# Patient Record
Sex: Male | Born: 1965
Health system: Western US, Academic
[De-identification: ages and names within clinical notes are randomized; demographics above are authoritative.]

---

## 2021-03-09 ENCOUNTER — Ambulatory Visit: Payer: MEDICAID

## 2021-03-09 ENCOUNTER — Inpatient Hospital Stay
Admit: 2021-03-10 | Discharge: 2021-03-10 | Discharge status: Against Medical Advice | Payer: MEDICAID | Source: Home / Self Care | Attending: Emergency Medical Services

## 2021-03-10 DIAGNOSIS — K56609 Unspecified intestinal obstruction, unspecified as to partial versus complete obstruction: Secondary | ICD-10-CM

## 2021-03-10 LAB — C-Reactive Protein: C-REACTIVE PROTEIN: <0.3 mg/dL (ref <0.8)

## 2021-03-10 LAB — Vitamin B12: VITAMIN B12: <150 pg/mL — ABNORMAL LOW (ref 254–1060)

## 2021-03-10 LAB — Phosphorus: PHOSPHORUS: 3.1 mg/dL (ref 2.3–4.4)

## 2021-03-10 LAB — Basic Metabolic Panel
CHLORIDE: 107 mmol/L — ABNORMAL HIGH (ref 96–106)
GLUCOSE: 86 mg/dL (ref 65–99)

## 2021-03-10 LAB — Expedited COVID-19 and Influenza A B PCR

## 2021-03-10 LAB — CREATININE: CREATININE: 0.92 mg/dL (ref 0.60–1.30)

## 2021-03-10 LAB — Sedimentation Rate, Erythrocyte: SEDIMENTATION RATE, ERYTHROCYTE: 3 mm/h (ref <=12)

## 2021-03-10 LAB — Lipid Panel: CHOLESTEROL, HDL: 43 mg/dL (ref >40–<100)

## 2021-03-10 LAB — Ferritin: FERRITIN: 11 ng/mL (ref 8–350)

## 2021-03-10 LAB — Troponin I: TROPONIN I: <0.04 ng/mL (ref <0.04)

## 2021-03-10 LAB — Haptoglobin: HAPTOGLOBIN: 104 mg/dL (ref 21–210)

## 2021-03-10 LAB — Folate,Serum: FOLATE,SERUM: 22.3 ng/mL (ref 8.1–30.4)

## 2021-03-10 LAB — Iron & Iron Binding Capacity: % SATURATION: 8 (ref 262–502)

## 2021-03-10 LAB — Differential Automated: ABSOLUTE MONO COUNT: 0.59 10*3/uL (ref 0.20–0.80)

## 2021-03-10 LAB — Magnesium
MAGNESIUM: 1.5 meq/L (ref 1.4–1.9)
MAGNESIUM: 1.4 meq/L (ref 1.4–1.9)

## 2021-03-10 LAB — Electrolyte Panel: SODIUM: 142 mmol/L (ref 135–146)

## 2021-03-10 LAB — Reticulocyte Count: ABSOLUTE RETIC #: 0.05 x10E6/uL (ref 0.03–0.19)

## 2021-03-10 LAB — Extra Lavender Top

## 2021-03-10 LAB — Glucose,POC: GLUCOSE,POC: 107 mg/dL — ABNORMAL HIGH (ref 65–99)

## 2021-03-10 LAB — Extra Light Green Top

## 2021-03-10 LAB — Prothrombin Time Panel: INR: 1 s (ref 11.5–14.4)

## 2021-03-10 LAB — Calcium: CALCIUM: 8.3 mg/dL — ABNORMAL LOW (ref 8.6–10.4)

## 2021-03-10 LAB — Lactate Dehydrogenase: LACTATE DEHYDROGENASE: 300 U/L — ABNORMAL HIGH (ref 125–256)

## 2021-03-10 LAB — Bilirubin,Conjugated: BILIRUBIN,CONJUGATED: <0.2 mg/dL (ref <=0.3)

## 2021-03-10 LAB — Alanine Aminotransferase: ALANINE AMINOTRANSFERASE: 23 U/L (ref 8–70)

## 2021-03-10 LAB — Glucose, Whole Blood: GLUCOSE, WHOLE BLOOD: 105 mg/dL — ABNORMAL HIGH (ref 65–99)

## 2021-03-10 LAB — D-Dimer: D-DIMER STAGO: 1.76 ug{FEU}/mL — ABNORMAL HIGH (ref <0.60)

## 2021-03-10 LAB — Blood Lactate: BLOOD LACTATE: 8 mg/dL (ref 5–18)

## 2021-03-10 LAB — Aspartate Aminotransferase: ASPARTATE AMINOTRANSFERASE: 25 U/L (ref 13–62)

## 2021-03-10 LAB — CEA: CARCINOEMBRYONIC ANTIGEN: 7.9 ng/mL — ABNORMAL HIGH (ref <3.1)

## 2021-03-10 LAB — Albumin: ALBUMIN: 3.8 g/dL — ABNORMAL LOW (ref 3.9–5.0)

## 2021-03-10 LAB — CBC
ABSOLUTE NUCLEATED RBC COUNT: 0 10*3/uL (ref 0.00–0.00)
MCH CONCENTRATION: 30.6 g/dL — ABNORMAL LOW (ref 31.5–35.5)

## 2021-03-10 LAB — Hgb A1c: HGB A1C - HPLC: 4.8 (ref <5.7)

## 2021-03-10 LAB — Alkaline Phosphatase: ALKALINE PHOSPHATASE: 73 U/L (ref 37–113)

## 2021-03-10 LAB — Fibrinogen: FIBRINOGEN: 278 mg/dL (ref 235–490)

## 2021-03-10 LAB — Bilirubin,Total: BILIRUBIN,TOTAL: 0.3 mg/dL (ref 0.1–1.2)

## 2021-03-10 LAB — APTT: APTT: 26.5 s (ref 24.4–36.2)

## 2021-03-10 MED ADMIN — HYDROMORPHONE HCL 1 MG/ML IJ SOLN: 2 mg | INTRAVENOUS | @ 04:00:00 | Stop: 2021-03-10 | NDC 00409128331

## 2021-03-10 MED ADMIN — HYDROMORPHONE HCL 2 MG/ML IJ SOLN: 2 mg | INTRAVENOUS | @ 09:00:00 | Stop: 2021-03-10 | NDC 00641615101

## 2021-03-10 MED ADMIN — HYDROMORPHONE HCL 1 MG/ML IJ SOLN: 2 mg | INTRAVENOUS | @ 06:00:00 | Stop: 2021-03-10 | NDC 00409128331

## 2021-03-10 MED ADMIN — BISACODYL 10 MG RE SUPP: 10 mg | RECTAL | @ 09:00:00 | Stop: 2021-03-10 | NDC 00574705012

## 2021-03-10 MED ADMIN — ALBUTEROL SULFATE 2.5 MG/0.5ML IN NEBU: 5 mg | RESPIRATORY_TRACT | @ 08:00:00 | Stop: 2021-03-10 | NDC 00487990130

## 2021-03-10 MED ADMIN — HYDROMORPHONE HCL 1 MG/ML IJ SOLN: 1 mg | INTRAVENOUS | @ 04:00:00 | Stop: 2021-03-10 | NDC 00409128331

## 2021-03-10 MED ADMIN — LACTATED RINGERS IV SOLN: 100 mL/h | INTRAVENOUS | @ 12:00:00 | Stop: 2021-03-10 | NDC 00338011704

## 2021-03-10 MED ADMIN — LACTATED RINGERS IV SOLN: 100 mL/h | INTRAVENOUS | @ 12:00:00 | Stop: 2021-03-10

## 2021-03-10 MED ADMIN — HYDROMORPHONE HCL 1 MG/ML IJ SOLN: 2 mg | INTRAVENOUS | @ 07:00:00 | Stop: 2021-03-10 | NDC 00409128331

## 2021-03-10 MED ADMIN — HYDROMORPHONE HCL 2 MG/ML IJ SOLN: 2 mg | INTRAVENOUS | @ 13:00:00 | Stop: 2021-03-10 | NDC 00641615101

## 2021-03-10 MED ADMIN — HYDROMORPHONE HCL 1 MG/ML IJ SOLN: 1 mg | INTRAVENOUS | @ 05:00:00 | Stop: 2021-03-10 | NDC 00409128331

## 2021-03-10 MED ADMIN — ONDANSETRON HCL 4 MG/2ML IJ SOLN: 4 mg | INTRAVENOUS | @ 07:00:00 | Stop: 2021-03-10 | NDC 00641607801

## 2021-03-10 MED ADMIN — HYDROMORPHONE HCL 1 MG/ML IJ SOLN: 1 mg | INTRAVENOUS | @ 11:00:00 | Stop: 2021-03-10 | NDC 00409128331

## 2021-03-10 MED ADMIN — MAGNESIUM SULFATE 4 GM/100ML IV SOLN: 4 g | INTRAVENOUS | @ 12:00:00 | Stop: 2021-03-10 | NDC 00409672923

## 2021-03-10 MED ADMIN — IPRATROPIUM BROMIDE 0.02 % IN SOLN: 500 ug | RESPIRATORY_TRACT | @ 08:00:00 | Stop: 2021-03-10 | NDC 00378797031

## 2021-03-10 MED ADMIN — SODIUM CHLORIDE 0.9 % IV SOLN: 75 mL/h | INTRAVENOUS | @ 12:00:00 | Stop: 2021-03-10 | NDC 00338004904

## 2021-03-10 MED ADMIN — HYDROMORPHONE HCL 2 MG/ML IJ SOLN: 2 mg | INTRAVENOUS | @ 11:00:00 | Stop: 2021-03-10 | NDC 00641615101

## 2021-03-10 NOTE — Progress Notes
Speech Language Pathology      PATIENT: Bob Garcia   Date: 03/10/2021  Time: 0900      Service Provided   Bedside swallow evaluation was attempted. The patient left AMA. SLP to cancel order. Discussed with RN Alena Bills.    Cammy Copa, SLP [phone]

## 2021-03-10 NOTE — H&P
Medicine - ADMISSION H&P    DATE OF SERVICE: 03/10/2021  ADMISSION DATE:  03/10/2021      ADMITTING TEAM: Medicine  ATTENDING PHYSICIAN: Fransico Michael, MD  RESIDENT PHYSICIAN: Florestine Avers, MD  PRIMARY CARE PROVIDER: Pcp, No, MD    CHIEF COMPLAINT: Abdominal pain    HPI:  Bob Garcia is a 55 y.o. 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     Pt frequently tangential during interview. Pt states that he came to the ED immediately after arriving at the airport from [facility] due to worsening abdominal pain and reported coffee ground emesis. Pt states that he was recently admitted at a [place] hospital for over 5 months due to bowel perforations and left AMA to fly to [place] due to his mother's death. He states that he has had worsening abdominal pain for the past 2-3 days with intermittent nausea and vomiting. His last BM was over 2 days ago and he reports usually having 15-20 small volume BMs daily. He states that he is not currently passing any flatus. He has been unable to tolerate any PO intake. He also endorses having dark colored stool but denies any hematochezia. He takes oral iron supplementation daily. He reports having undergone a colonoscopy 3-4 weeks ago during which new rectal masses were found through which the colonoscope was unable to be traversed. He states that he has had prior chemoradiation for his lung cancer but has not had any recent treatment. He reports 100 pound unintentional weight loss but is unable to state the time period over which he had this weight loss.    Pt reports that his PCP in [place] is Dr. Jena Gauss and his prior surgeon is Dr. Allena Katz.     ED COURSE:  ED Triage Vitals   Temp Temp Source BP Heart Rate Resp SpO2 O2 Device Pain Score Weight   03/09/21 2003 03/09/21 2003 03/09/21 2003 03/09/21 2003 03/09/21 2003 03/09/21 2003 03/09/21 2003 03/09/21 2015 03/09/21 2015   36.6 ?C (97.9 ?F) Temporal 135/81 75 19 98 % None (Room air) Ten 72.6 kg (160 lb)     Pt with acute LUE and LLE weakness as well as L facial droop. Stroke code called and Neurology consulted. Pt found to have acute ischemia of right centrum semiovale as well as old left cerebellar lacunar infarcts. CT KUB with prominent gaseous distention of colon concerning for malignant large bowel obstruction. General Surgery consulted. Extensive discussion with patient regarding importance of bowel rest and possible surgical or procedural intervention. Pt refused rectal exam and also demanded bisacodyl suppository and Ensures. Pt also with numerous behavioral disturbances during time in the ED.     REVIEW OF SYSTEMS:  A complete review of 14 systems was performed. Additional symptoms were otherwise negative and/or non-contributory, except those listed above.    PAST MEDICAL HISTORY:  Past Medical History:   Diagnosis Date    Anemia     Cancer (HCC/RAF)     Hypertension        PAST SURGICAL HISTORY:  Past Surgical History:   Procedure Laterality Date    ABDOMINAL SURGERY         SOCIAL HISTORY:  Gen: Lives with wife in [place]   Tobacco: Prior  smoker, quit 1 year, 4-5 cigarettes daily since 2001   Alcohol: Denies   Illicits: Cannabis     FAMILY HISTORY:  family history is not on file.    HOME MEDICATIONS:  Prior to Admission medications    Medication Sig Start Date End Date Taking? Authorizing Provider   ATENOLOL PO Take by mouth.    PROVIDER, HISTORICAL   calcium carbonate 500 mg chewable tablet Chew 1 tablet by mouth daily Calcium Carbonate 500 mg is equivalent to 200 mg elemental Calcium.    PROVIDER, HISTORICAL   Ferrous Sulfate Dried (FERROUS SULFATE CR PO) Take by mouth.    PROVIDER, HISTORICAL   Potassium Acetate POWD by Does not apply route.    PROVIDER, HISTORICAL       INPATIENT MEDICATIONS:  Scheduled:   [START ON 03/11/2021] bisacodyl  10 mg Rectal Daily    cyanocobalamin  1,000 mcg Intramuscular Daily     PRN:  HYDROmorphone    Infusions:   sodium chloride 75 mL/hr (03/10/21 0503)       ALLERGIES  Allergies   Allergen Reactions    Iodinated Diagnostic Agents Anaphylaxis    Fentanyl     Morphine        PHYSICAL EXAM:  Last Recorded Vital Signs:    03/10/21 0618   BP: 112/73   Pulse: 72   Resp: 18   Temp: 37.1 ???C (98.8 ???F)   SpO2: 98%     General: Chronically ill-appearing, alert, conversant, appears uncomfortable   HEENT: Normocephalic, atraumatic, conjunctivae clear, anicteric sclerae, OP clear, poor dentition   Heart: RRR, s1+s2, no m/r/g  Lungs: CTAB, no w/r/r, unlabored respirations  Abdomen: Protuberant, firm abdomen, tender to palpation throughout, no rebound or guarding  MSK/Skin/Extrem: No LE edema   Neuro: L sided facial droop, LUE flaccid paralysis, decreased sensation to light touch on L side     LABS:  CBC  Recent Labs     03/10/21  0521 03/09/21  2033   WBC 2.48* 3.00*   NEUTABS 1.34*  --    HGB 7.5* 7.9*   HCT 24.5* 25.7*   MCV 90.7 90.2   PLT 142* 158     BMP  Recent Labs     03/10/21  0521 03/09/21  2033   NA 140 142   K 3.8 4.0   CL 107* 102   CO2 22 23   BUN 10  --    CREAT 0.84 0.92   CALCIUM 8.4* 8.3*   MG 1.4 1.5   PHOS  --  3.1     LFTs  Recent Labs     03/09/21  2033   ALBUMIN 3.8*   BILITOT 0.3   BILICON <0.2   ALT 23   AST 25   ALKPHOS 73     Coags  Recent Labs     03/09/21  2033   INR 1.0   PT 13.0   APTT 26.5     Glucose  Recent Labs     03/09/21  2007   GLUCOSEPOC 107*     Results for orders placed or performed during the hospital encounter of 03/09/21   D-Dimer (ED Stroke Signs/Suspected: Nurse Protocol)   Result Value Ref Range    D-Dimer 1.76 (H) <0.60 ug/mL FEU    Narrative    *ALERT:New Reference Range (<0.6 ug/mL FEU)*  *ALERT:New Reportable Units (ug/mL FEU)*  *ALERT:New Cut-off Values for DVT/PE    Note: Quantitative method to detect D-Dimer.  Cut-off value to rule out DVT/PE in patients with low/moderate pretest probability: <0.50 ug/mL FEU     Results for orders placed or performed during the hospital encounter of 03/09/21   Fibrinogen (ED Stroke Signs/Suspected: Nurse Protocol)   Result Value Ref Range    Fibrinogen 278 235 - 490 mg/dL     Results for orders placed or performed during the hospital encounter of 03/09/21   Lactate   Result Value Ref Range    Blood Lactate 8 5 - 18 mg/dL     Results for orders placed or performed during the hospital encounter of 03/09/21   Iron & Iron Binding Capacity   Result Value Ref Range    Iron 36 (L) 41 - 179 mcg/dL    Iron Binding Capacity 470 262 - 502 mcg/dL    % Saturation 8 %     Results for orders placed or performed during the hospital encounter of 03/09/21   Ferritin   Result Value Ref Range    Ferritin 11 8 - 350 ng/mL     Results for orders placed or performed during the hospital encounter of 03/09/21   Vitamin B12   Result Value Ref Range    Vitamin B12 <150 (L) 254 - 1,060 pg/mL     Results for orders placed or performed during the hospital encounter of 03/09/21   [facility]   Result Value Ref Range    Folate,Serum 22.3 8.1 - 30.4 ng/mL       Results for orders placed or performed during the hospital encounter of 03/09/21   CEA   Result Value Ref Range    CEA 7.9 (H) <3.1 ng/mL     Results for orders placed or performed during the hospital encounter of 03/09/21   Haptoglobin   Result Value Ref Range    Haptoglobin 104 21 - 210 mg/dL       Results for orders placed or performed during the hospital encounter of 03/09/21   LD   Result Value Ref Range    Lactate Dehydrogenase 300 (H) 125 - 256 U/L     Results for orders placed or performed during the hospital encounter of 03/09/21   Reticulocyte Count   Result Value Ref Range    Reticulocyte Count, Auto 1.73 No Ref. Range %    Absolute Retic # 0.05 0.03 - 0.19 x10E6/uL    Immature Retic Fraction 28.3 (H) 2.6 - 15.8 %    Retic Hemoglobin Content 26.8 (L) 27.1 - 40.3 pg       IMAGING/STUDIES:  XR kub (1 view)   Final Result by Eloy End. (05/16 0355)      CT kub wo contrast   Final Result by Eloy End. (05/16 0148)   IMPRESSION:      1. Limited evaluation of the bowel on noncontrast scan in the setting of diminished intra-abdominal fat. Prominent gaseous distention of colon. No overt small bowel dilation.   2. Mildly enlarged retroperitoneal lymph nodes and prominent iliac chain lymph nodes are of indeterminate clinical significance. Findings may be reactive, however, neoplastic causes cannot be excluded.   3. Hyperdense lesion in the upper pole of the left kidney is not fully characterized on limited noncontrast scan and may represent hemorrhagic cyst. Consider outpatient follow-up with CT renal protocol in the nonacute setting.   4. Cholelithiasis without biliary dilation or other signs of acute cholecystitis.   5. Clustered nodularity in the lung bases may represent a mild inflammatory and/or infectious  process.         I, Royetta Asal, MD, PhD, have reviewed the study and agree with the findings presented above.         Signed by: Royetta Asal   03/10/2021 1:48 AM      CT brain wo contrast   Final Result by Rayfield Citizen, MD (05/15 2217)   IMPRESSION:      No evidence of acute intracranial hemorrhage or mass effect.      Signed by: Rayfield Citizen   03/09/2021 10:17 PM      MR neck angiogram wo contrast   Final Result by Rayfield Citizen, MD (05/15 2213)   IMPRESSION:      MRI BRAIN: Indistinct region of mildly restricted diffusion in the right centrum semiovale, concerning for a region of acute ischemia. Punctate foci of diffusion-weighted hyperintensity in the left cerebellar hemisphere that may represent acute or    subacute lacunar infarcts. 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 No acute intracranial hemorrhage or significant mass effect.      MRA BRAIN: No significant proximal intracranial stenosis or occlusion.      MRA NECK: No hemodynamically-significant focal stenosis.      Signed by: Rayfield Citizen   03/09/2021 10:13 PM      MR brain angiogram wo contrast   Final Result by Rayfield Citizen, MD (05/15 2213)   IMPRESSION:      MRI BRAIN: Indistinct region of mildly restricted diffusion in the right centrum semiovale, concerning for a region of acute ischemia. Punctate foci of diffusion-weighted hyperintensity in the left cerebellar hemisphere that may represent acute or    subacute lacunar infarcts. 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 No acute intracranial hemorrhage or significant mass effect.      MRA BRAIN: No significant proximal intracranial stenosis or occlusion.      MRA NECK: No hemodynamically-significant focal stenosis.      Signed by: Rayfield Citizen   03/09/2021 10:13 PM      MR brain wo contrast   Final Result by Rayfield Citizen, MD (05/15 2213)   IMPRESSION:      MRI BRAIN: Indistinct region of mildly restricted diffusion in the right centrum semiovale, concerning for a region of acute ischemia. Punctate foci of diffusion-weighted hyperintensity in the left cerebellar hemisphere that may represent acute or    subacute lacunar infarcts. 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 No acute intracranial hemorrhage or significant mass effect.      MRA BRAIN: No significant proximal intracranial stenosis or occlusion.      MRA NECK: No hemodynamically-significant focal stenosis.      Signed by: Rayfield Citizen   03/09/2021 10:13 PM      CT brain wo contrast    (Results Pending)   Echo adult transthoracic w bubble study    (Results Pending)       MICROBIOLOGY:  Results for orders placed or performed during the hospital encounter of 03/09/21   Expedited COVID-19 and Influenza A B PCR, Respiratory Upper (ED Stroke Signs/Suspected: Nurse Protocol)    Specimen: Nasopharyngeal; Respiratory, Upper   Result Value Ref Range    Specimen Type Respiratory, Upper     COVID-19 PCR/TMA Not Detected Not Detected    Influenza A PCR Not Detected Not Detected    Influenza B PCR Not Detected Not Detected    Narrative    This test is intended for in vitro diagnostic use under FDA Emergency Use Authorization only. Results are for the presumptive identification of COVID-19, Influenza A, and  Influenza B RNA. The [facility] is certified under the Clinical Laboratory Improvement Amendments of 1988 (CLIA-88) as qualified to perform high complexity clinical laboratory testing.       ASSESSMENT/PLAN:  Bob Garcia is a 55 y.o. 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     #Recurrent malignant large bowel obstruction  #Acute abdominal pain  #Hx metastatic small cell lung cancer  #Reported rectal masses  Pt w/ reported hx of metastatic small cell lung cancer c/b small and large bowel metastases with numerous prior abdominal surgeries for bowel perforation and malignant bowel obstruction. No OSH records available currently to corroborate pt's report. Pt found to have large bowel obstruction likely in setting of known malignancy and numerous prior abdominal surgeries. Pt declining surgical intervention as well as bowel rest despite extensive counseling.   - General Surgery consulted   - Serial abdominal exams  - Bisacodyl suppository  - Consider GI consult as below  - Consider Palliative Care consult  - Dilaudid 2mg  IV q2h PRN for severe or breakthrough pain       #Acute R centrum semiovale ischemic stroke   #L hemiparesis   Likely in setting of hypercoagulability from malignancy. Deferring thrombolytic therapy and antiplatelet therapy in setting of reported coffee ground emesis and pt's reported acute on chronic anemia.   - Neurology consulted  - Consider repeat CT brain to assess for intracranial hemorrhage  - F/u Hgb A1c, lipid panel  - F/u TTE w/ bubble  - PT/OT/SLP eval    #Iron deficiency anemia   - Ferrous sulfate 325mg  daily once able to tolerate PO. Can consider IV iron in interim  - Vitamin B12 intramuscular injection daily   - Consider GI consult for colonoscopy in setting of reported rectal masses and iron deficiency anemia   -  Trend CBC       Inpatient Checklist  Diet:Orders Placed This Encounter      Oral nutrition supplements Dinner; Ensure Compact    DVT Prophylaxis: sequential compression devices (SCDs)  GI Prophylaxis: not indicated  Central Lines: none  Tubes/Drains: none    Code Status: Full Code   Primary Emergency Contact: Quiggle,JERRY  PMD: Pcp, No, MD    DISPOSITION: Pending further management of large bowel obstruction     Pt to be discussed with attending, Fransico Michael, MD.    Florestine Avers, MD  Internal Medicine, PGY-3  [phone]      Attending Addendum    This patient opted to leave against medical advice prior to my ability to evaluate patient. He was deemed by housestaff to have capacity to make this decision to leave against medical advice despite it being a poor decision. His plan is to present to [facility] which demonstrates that he understands that he requires medical care but he would prefer it at another hospital.    Fransico Michael, MD  Attending Physician  [facility]  03/10/2021 3:30 PM

## 2021-03-10 NOTE — ED Notes
Patient called informing water spilled on bed and wanting linens to be changed. Informed someone will be in to help change his linens as soon as possible.  Patient impulsive, impatient, yelling, demanding, disconnected self from IV fluids, out of bed, throwing linens, gown, trash onto floor. Attempted to help patient, change linens, but per patient, '' I can do it myself,'' refusing assistance and continues to throw linens to the grown, putting shoes on stating, ''I'm getting out of here. I don't want to be here with people accusing me of things. There's people who are out in the hallway. '' MD notified of patient wanting to leave.

## 2021-03-10 NOTE — ED Notes
MD Ladona Ridgel called to MRI. Pt refusing MRI without music, then requesting more pain medication and requesting for all the tests/imaging to be done at once. Pt received 3mg  of Dilaudid prior to imaging. Pt stating'' Im getting the fuck out of here if I don't get it all at once.'' MD Ladona Ridgel at MRI speaking with pt

## 2021-03-10 NOTE — H&P
GENERAL SURGERY CONSULTATION NOTE    PATIENT: Bob Garcia  MRN: [id number]  DOB: 03-15-1966  DATE OF SERVICE: 03/10/2021   ATTENDING: Dr. Benjamine Mola    REFERRING PHYSICIAN: Dr. Meda Coffee    Subjective:     Reason for Referral: abdominal pain and melena    History of Present Illness:  Bob Garcia is a 55 y.o. male with a history of colon cancer who presents with abdominal pain.    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  He reports that he has perforated multiple times.  He additionally has had multiple GI bleeds in the past and deals with both chronic diarrhea and anemia.  His surgical history also includes an orchiectomy.  He has been treated for his lung cancer with chemotherapy and radiation.  He has never had radiation to his abdomen.    He was recently being treated at a hospital in [facility] when he made the decision to come to [place] to deal with his affairs after his mother died.  He got off the plane and came straight to the hospital with symptoms including 10/10 abdominal pain and continued coffee ground emesis.  While in the ED here, he experienced an acute headache an onset of total left sided hemiplegia.  He was taken emergently to the MRI and CT scanners and was found to have an ischemic stroke of the right centrum ovale.  He has never had a stroke before.    He reports having a colonoscopy 3.5 weeks ago in which it was not possible to traverse the scope past two high rectal lesions .    He last ate the day before yesterday and has not passed gas since yesterday.  His last bowel movement was also yesterday and was described as melena.  He reports that his last hemoglobin was 11.2 just two days ago.  Per his report, he was going to be made comfort care in [facility] before coming here to [place].  He explicitly states that he wants to be DNR and does not want an ostomy.  He has had prior episodes of abdominal pain similar to this that have resolved by taking Ensures and dulcolax suppositories.  He is adamant that this will work, although is willing to try other decompressive strategies should this fail.  He had that multiple attempts at endoscopic decompression had failed recently.    In the ED, his vitals are notable for normal BP and HR.  Hi is afebrile.  His labs are notable for leukopenia of 3, Hb 7.9, plt 158, Cr 0.92, and lactate 8.    Past History:  Past Medical History:   Diagnosis Date    Anemia     Cancer (HCC/RAF)     Hypertension         Past Surgical History:   Procedure Laterality Date    ABDOMINAL SURGERY         Family History: No family history on file.    Social History:   Social History     Socioeconomic History    Marital status: Single       Allergies:   Allergies   Allergen Reactions    Iodinated Diagnostic Agents Anaphylaxis    Fentanyl  Morphine         Medications:   Home medications:   Prior to Admission medications    Medication Sig Start Date End Date Taking? Authorizing Provider   ATENOLOL PO Take by mouth.    PROVIDER, HISTORICAL   calcium carbonate 500 mg chewable tablet Chew 1 tablet by mouth daily Calcium Carbonate 500 mg is equivalent to 200 mg elemental Calcium.    PROVIDER, HISTORICAL   Ferrous Sulfate Dried (FERROUS SULFATE CR PO) Take by mouth.    PROVIDER, HISTORICAL   Potassium Acetate POWD by Does not apply route.    PROVIDER, HISTORICAL       Current hospital medications:   Scheduled Meds:   HYDROmorphone  2 mg IV Push STAT    ondansetron injection/IVPB  4 mg Intravenous Once     Continuous Infusions:  PRN Meds:.    Review of Systems: Review of systems was negative except as noted per HPI.    Objective:     Physical Exam:    Vital Signs: BP 129/85  ~ Pulse 56  ~ Temp 37.3 ???C (99.1 ???F) (Oral)  ~ Resp 19  ~ Ht 1.854 m (6' 1'')  ~ Wt 72.6 kg (160 lb)  ~ SpO2 99%  ~ BMI 21.11 kg/m???       Gen: Chronically ill appearing  HEENT: NCAT, EOMI, oropharynx clear with MMM  CV: RRR  RESP: Breathing unlabored, CTAB  ABD: Firm, tender with voluntary guarding, mildly distended, well healed midline scar  EXTR: WWP    Labs:    Recent Results (from the past 24 hour(s))   POCT glucose    Collection Time: 03/09/21  8:07 PM   Result Value Ref Range    Glucose, Point of Care 107 (H) 65 - 99 mg/dL   CBC (ED Stroke Signs/Suspected: Nurse Protocol)    Collection Time: 03/09/21  8:33 PM   Result Value Ref Range    White Blood Cell Count 3.00 (L) 4.16 - 9.95 x10E3/uL    Red Blood Cell Count 2.85 (L) 4.41 - 5.95 x10E6/uL    Hemoglobin 7.9 (L) 13.5 - 17.1 g/dL    Hematocrit 40.9 (L) 38.5 - 52.0 %    Mean Corpuscular Volume 90.2 79.3 - 98.6 fL    Mean Corpuscular Hemoglobin 27.7 26.4 - 33.4 pg    MCH Concentration 30.7 (L) 31.5 - 35.5 g/dL    Red Cell Distribution Width-SD 67.8 (H) 36.9 - 48.3 fL    Red Cell Distribution Width-CV 20.7 (H) 11.1 - 15.5 %    Platelet Count, Auto 158 143 - 398 x10E3/uL    Mean Platelet Volume 9.3 9.3 - 13.0 fL    Nucleated RBC%, automated 0.0 No Ref. Range %    Absolute Nucleated RBC Count 0.00 0.00 - 0.00 x10E3/uL   Electrolyte Panel (ED Stroke Signs/Suspected: Nurse Protocol)    Collection Time: 03/09/21  8:33 PM   Result Value Ref Range    Sodium 142 135 - 146 mmol/L    Potassium 4.0 3.6 - 5.3 mmol/L    Chloride 102 96 - 106 mmol/L    Total CO2 23 20 - 30 mmol/L    Anion Gap 17 8 - 19 mmol/L   Creatinine,whole blood (ED Stroke Signs/Suspected: Nurse Protocol)    Collection Time: 03/09/21  8:33 PM   Result Value Ref Range    Creatinine 0.92 0.60 - 1.30 mg/dL    GFR Estimate for African American >89 See GFR Additional Information mL/min/1.87m2  GFR Estimate for Non-African American >89 See GFR Additional Information mL/min/1.32m2    GFR Additional Information See Comment    Glucose (ED Stroke Signs/Suspected: Nurse Protocol)    Collection Time: 03/09/21  8:33 PM   Result Value Ref Range    Glucose 105 (H) 65 - 99 mg/dL   Troponin I (ED Stroke Signs/Suspected: Nurse Protocol)    Collection Time: 03/09/21  8:33 PM   Result Value Ref Range    Troponin I <0.04 <0.04 ng/mL   Sedimentation Rate, Erythrocyte (ED Stroke Signs/Suspected: Nurse Protocol)    Collection Time: 03/09/21  8:33 PM   Result Value Ref Range    Sedimentation rate, Erythrocyte 3 <=12 mm/hr   Prothrombin time (PT/INR) (ED Stroke Signs/Suspected: Nurse Protocol)    Collection Time: 03/09/21  8:33 PM   Result Value Ref Range    Prothrombin Time 13.0 11.5 - 14.4 seconds    INR 1.0 .   APTT (ED Stroke Signs/Suspected: Nurse Protocol)    Collection Time: 03/09/21  8:33 PM   Result Value Ref Range    APTT 26.5 24.4 - 36.2 seconds   Fibrinogen (ED Stroke Signs/Suspected: Nurse Protocol)    Collection Time: 03/09/21  8:33 PM   Result Value Ref Range    Fibrinogen 278 235 - 490 mg/dL   D-Dimer (ED Stroke Signs/Suspected: Nurse Protocol)    Collection Time: 03/09/21  8:33 PM   Result Value Ref Range    D-Dimer 1.76 (H) <0.60 ug/mL FEU   Albumin (ED Stroke Signs/Suspected: Nurse Protocol)    Collection Time: 03/09/21  8:33 PM   Result Value Ref Range    Albumin 3.8 (L) 3.9 - 5.0 g/dL   AST (SGOT) (ED Stroke Signs/Suspected: Nurse Protocol)    Collection Time: 03/09/21  8:33 PM   Result Value Ref Range    Aspartate Aminotransferase 25 13 - 62 U/L   ALT (SGPT) (ED Stroke Signs/Suspected: Nurse Protocol)    Collection Time: 03/09/21  8:33 PM   Result Value Ref Range    Alanine Aminotransferase 23 8 - 70 U/L   Bilirubin,Total (ED Stroke Signs/Suspected: Nurse Protocol)    Collection Time: 03/09/21  8:33 PM   Result Value Ref Range    Bilirubin,Total 0.3 0.1 - 1.2 mg/dL   Bilirubin,Conj (ED Stroke Signs/Suspected: Nurse Protocol)    Collection Time: 03/09/21  8:33 PM   Result Value Ref Range    Bilirubin,Conjugated <0.2 <=0.3 mg/dL   Alkaline Phosphatase (ED Stroke Signs/Suspected: Nurse Protocol) Collection Time: 03/09/21  8:33 PM   Result Value Ref Range    Alkaline Phosphatase 73 37 - 113 U/L   Phosphorus (ED Stroke Signs/Suspected: Nurse Protocol)    Collection Time: 03/09/21  8:33 PM   Result Value Ref Range    Phosphorus 3.1 2.3 - 4.4 mg/dL   Calcium (ED Stroke Signs/Suspected: Nurse Protocol)    Collection Time: 03/09/21  8:33 PM   Result Value Ref Range    Calcium 8.3 (L) 8.6 - 10.4 mg/dL   Magnesium (ED Stroke Signs/Suspected: Nurse Protocol)    Collection Time: 03/09/21  8:33 PM   Result Value Ref Range    Magnesium 1.5 1.4 - 1.9 mEq/L   C-Reactive Protein (ED Stroke Signs/Suspected: Nurse Protocol)    Collection Time: 03/09/21  8:33 PM   Result Value Ref Range    C-Reactive Protein <0.3 <0.8 mg/dL   Type and screen (ED Stroke Signs/Suspected: Nurse Protocol)    Collection Time: 03/09/21  8:33 PM  Result Value Ref Range    Antibody Screen Negative     ABORh O Positive    Extra Light Green Top    Collection Time: 03/09/21  8:33 PM   Result Value Ref Range    Extra Tube Performed    Extra Lavender Top    Collection Time: 03/09/21  8:33 PM   Result Value Ref Range    Extra Tube Performed    ECG 12 lead (ED Stroke Signs/Suspected: Nurse Protocol)    Collection Time: 03/09/21  8:35 PM   Result Value Ref Range    Ventricular Rate 61 BPM    Atrial Rate 61 BPM    P-R Interval 135 ms    QRS Duration 116 ms    Q-T Interval 415 ms    QTC Calculation (Bezet) 418 ms    P Axis 75 degrees    R Axis 97 degrees    T Axis 37 degrees    Diagnosis Sinus rhythm     Diagnosis Nonspecific intraventricular conduction delay     Diagnosis Minimal ST elevation, anterior leads     Diagnosis 12 Lead; Mason-Likar     Diagnosis Abnormal ECG     Diagnosis     Expedited COVID-19 and Influenza A B PCR, Respiratory Upper (ED Stroke Signs/Suspected: Nurse Protocol)    Collection Time: 03/09/21  8:36 PM    Specimen: Nasopharyngeal; Respiratory, Upper   Result Value Ref Range    Specimen Type Respiratory, Upper     COVID-19 PCR/TMA Not Detected Not Detected    Influenza A PCR Not Detected Not Detected    Influenza B PCR Not Detected Not Detected       Studies:  CT brain wo contrast    Result Date: 03/09/2021  IMPRESSION: No evidence of acute intracranial hemorrhage or mass effect. Signed by: Rayfield Citizen   03/09/2021 10:17 PM    MR brain angiogram wo contrast    Result Date: 03/09/2021  IMPRESSION: MRI BRAIN: Indistinct region of mildly restricted diffusion in the right centrum semiovale, concerning for a region of acute ischemia. Punctate foci of diffusion-weighted hyperintensity in the left cerebellar hemisphere that may represent acute or subacute lacunar infarcts. 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 No acute intracranial hemorrhage or significant mass effect. MRA BRAIN: No significant proximal intracranial stenosis or occlusion. MRA NECK: No hemodynamically-significant focal stenosis. Signed by: Rayfield Citizen   03/09/2021 10:13 PM    MR neck angiogram wo contrast    Result Date: 03/09/2021  IMPRESSION: MRI BRAIN: Indistinct region of mildly restricted diffusion in the right centrum semiovale, concerning for a region of acute ischemia. Punctate foci of diffusion-weighted hyperintensity in the left cerebellar hemisphere that may represent acute or subacute lacunar infarcts. 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 No acute intracranial hemorrhage or significant mass effect. MRA BRAIN: No significant proximal intracranial stenosis or occlusion. MRA NECK: No hemodynamically-significant focal stenosis. Signed by: Rayfield Citizen   03/09/2021 10:13 PM    MR brain wo contrast    Result Date: 03/09/2021  IMPRESSION: MRI BRAIN: Indistinct region of mildly restricted diffusion in the right centrum semiovale, concerning for a region of acute ischemia. Punctate foci of diffusion-weighted hyperintensity in the left cerebellar hemisphere that may represent acute or subacute lacunar infarcts. 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 No acute intracranial hemorrhage or significant mass effect. MRA BRAIN: No significant proximal intracranial stenosis or occlusion. MRA NECK: No hemodynamically-significant focal stenosis. Signed by: Rayfield Citizen   03/09/2021 10:13 PM    CT kub wo  contrast    Result Date: 03/10/2021  IMPRESSION: 1. Limited evaluation of the bowel on noncontrast scan in the setting of diminished intra-abdominal fat. Prominent gaseous distention of colon. No overt small bowel dilation. 2. Mildly enlarged retroperitoneal lymph nodes and prominent iliac chain lymph nodes are of indeterminate clinical significance. Findings may be reactive, however, neoplastic causes cannot be excluded. 3. Hyperdense lesion in the upper pole of the left kidney is not fully characterized on limited noncontrast scan and may represent hemorrhagic cyst. Consider outpatient follow-up with CT renal protocol in the nonacute setting. 4. Cholelithiasis without biliary dilation or other signs of acute cholecystitis. 5. Clustered nodularity in the lung bases may represent a mild inflammatory and/or infectious process. I, Royetta Asal, MD, PhD, have reviewed the study and agree with the findings presented above. Signed by: Royetta Asal   03/10/2021 1:48 AM        Assessment/Plan:     Mozell Rosenboom is a 55 y.o. male with a history of lung cancer metastatic to his testicle and large and small both with a substantial abdominal surgical history who presented to the ED with abdominal pain, distension, and coffee ground emesis and then had a stroke while in the ED.  His CT scan, along with his history, is concerning for an impending large bowel obstruction.  He reports two rectal masses large enough to impede a colonoscope three weeks ago.  He has additionally stopped passing gas or having bowel movements for the last 24 hours.    However, he just had an acute stroke in the ED.  Moreover, he appears to be having a GI bleed.  He is adamant in wanting to try non interventional measures before agreeing to procedural or surgical intervention.  The surgical team repeatedly suggested a digital rectal exam but the patient was adamant in his refusal.      He has capacity to refuse this care, and emergent surgery in the context of a recent stroke would be ill-advised.  His care will require continued close clinical observation as well as continued conversation with the patient regarding his willingness to entertain procedural intervention.    Recommendations:  - No acute surgical intervention per patient preference  - Admission to medicine  - Obtain outside hospital records  - Attempt bisacodyl suppository  - DRE when patient is amenable  - GI consult  - Neurology consult, appreciate recs  - Palliative consult for pain management as well as end-of-life care  - Serial abdominal exams  - Surgery will continue to follow    Patient seen and examined by and plan developed with the attending physician, Dr. Benjamine Mola    Author: Annamarie Dawley. Malissa Hippo, MD 03/10/2021 12:02 AM    I saw this patient with the Resident Physician and agree with the assessment and plan.

## 2021-03-10 NOTE — ED Notes
Pt requesting more pain medication stating ''If the doctor says no, Ill leave.'' MD Ladona Ridgel made aware, pt to be medicated

## 2021-03-10 NOTE — Discharge Summary
Discharge Summary   Name: Bob Garcia MRN: [id number] DOB: 12-16-1965   Admit Date: 03/10/2021   D/C Date:  LOS:   0 days    Admit Attending: No admitting provider for patient encounter. Discharge Attending: Fransico Michael, MD    PCP: Aviva Kluver, MD Discharge Provider: Cleopatra Cedar, MD     Inpatient Care Team/ Consults:  Treatment Team:   Team: Consult, Neurology - Stroke  Team: L/A, Surgery - Acute/Trauma Consult -  Team: A, Medicine - Team  Primary - Intern: Charlesetta Garibaldi., MD  Primary - Resident: Cleopatra Cedar, MD    Outpatient Care Team  No care team member to display     Admission Diagnosis:  (reason for admission)  Abdominal pain  Unable to pass gas  Discharge Diagnosis:  (conditions treated during hospitalization)  Reported history of lung cancer with metastasis to colon   Disposition:  Left AMA    Discharge Condition:  Guarded       HPI:   The patient reports a 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  He reports that he has perforated multiple times.  He additionally has had multiple GI bleeds in the past and deals with both chronic diarrhea and anemia.  His surgical history also includes an orchiectomy.  He has been treated for his lung cancer with chemotherapy and radiation.  He has never had radiation to this abdomen.    Pt frequently tangential during interview. Pt states that he came to the ED immediately after arriving at the airport from [facility] due to worsening abdominal pain and reported coffee ground emesis. Pt states that he was recently admitted at a [place] hospital for over 5 months due to bowel perforations and left AMA to fly to [place] due to his mother's death. He states that he has had worsening abdominal pain for the past 2-3 days with intermittent nausea and vomiting. His last BM was over 2 days ago and he reports usually having 15-20 small volume BMs daily. He states that he is not currently passing any flatus. He has been unable to tolerate any PO intake. He also endorses having dark colored stool but denies any hematochezia. He takes oral iron supplementation daily. He reports having undergone a colonoscopy 3-4 weeks ago during which new rectal masses were found through which the colonoscope was unable to be traversed. He states that he has had prior chemoradiation for his lung cancer but has not had any recent treatment. He reports 100 pound unintentional weight loss but is unable to state the time period over which he had this weight loss.  ???  Pt reports that his PCP in [place] is Dr. Jena Gauss and his prior surgeon is Dr. Allena Katz.   ???  While in the ED here, he experienced an acute headache an onset of total left sided hemiplegia.  He was taken emergently to the MRI and CT scanners and was found to have an ischemic stroke of the right centrum ovale.  He has never had a stroke before.  ???  He reports having a colonoscopy 3.5 weeks ago in which it was not possible to traverse the scope past two high rectal lesions - these are ''almost in his stomach.''  ???  He last ate the  day before yesterday and has not passed gas since yesterday.  His last bowel movement was also yesterday and was described as melena.  He reports that his last hemoglobin was 11.2 just two days ago.  Per his report, he was going to be made comfort care in [facility] before coming here to [place].  He explicitly states that he wants to be DNR and does not want an ostomy.  He has had prior episodes of abdominal pain similar to this that have resolved by taking Ensures and dulcolax suppositories.  He is adamant that this will work, although is willing to try Bob decompressive strategies should this fail.  He had that multiple attempts at endoscopic decompression had failed recently.  ???    Hospital Course:      In the ED, his vitals are notable for normal BP and HR.  Hi is afebrile.  His labs are notable for leukopenia of 3, Hb 7.9, plt 158, Cr 0.92, and lactate 8.     Seen by neurology in the ED given acute neurologic findings. Brain infarcts thought to be 2/2 known cancer with likely associated hypercoagulable state. No recommended medications in setting of GIB, recommended labs and c/s with PT, OT, and SLP. General surgery consulted and given CT findings and symptoms - concerning for an impending large bowel obstruction. - patient declined acute intervention. However, he just had an acute stroke in the ED.  Moreover, he appears to be having a GI bleed.  He is adamant in wanting to try non interventional measures before agreeing to procedural or surgical intervention. The surgical team repeatedly suggested a digital rectal exam but the patient was adamant in his refusal.      Seen in the morning by primary medicine, patient was upset about events in the ED and left AMA. He says he will go to a different hospital.     Significant event note: Patient states that his nurse is not readily available and he didn't receive help transferring to the MRI for example. Per chart review, patient has been frustrated throughout the night and at one point left the ED to go outside possibly to smoke a cigarette. He is frustrated that he hasn't received IV dilaudid since 4 AM. On examination, patient appears chronically ill and yelling. He is speaking in complete sentences and demonstrates the left upper extremity hemiparesis. I attempted to get IV pain medications in an expedited fashion and speak with pain, but he continued to yell loudly and become progressively more angry about overnight events. He is asking to leave AMA to go to another institution. Risks discussed about leaving AMA including worsening bleeding, pain, and death. Patient understands and is still asking to leave. Code gray called once patient got up from bed and was yelling in the hallway. RN attempted to assist with removing PIV, but patient declined assistance and removed PIV himself, leading to mild amount of bleeding. Patient signed AMA and was assisted out of the ED with security.     Procedures & Operations Performed:   None    Relevant Imaging Studies (most recent results only):   MR brain angiogram wo contrast   MR neck angiogram wo contrast  03/09/21  FINDINGS:  There is a faint area of restricted diffusion without gross FLAIR signal abnormality in the right centrum semiovale (series 2, image 71; series 3, image 19).  ?  Old left cerebellar lacunar infarcts are noted. In addition, 2 punctate foci of diffusion-weighted hyperintensity with equivocal ADC  correlate are noted in the left cerebellar hemisphere (series 2, image 59), which may represent acute or subacute lacunar infarcts.  ?  There is no evidence of acute intracranial hemorrhage, mass effect, hydrocephalus, or midline shift.  ?  Several foci of susceptibility artifact are noted in the central pons and right cerebral peduncle, likely representing old microhemorrhages.  ?  Several small foci of FLAIR hyperintensity are noted in the cerebral white matter bilaterally, which are nonspecific but may represent mild chronic microvascular ischemic changes.  ?  MRA of the brain demonstrates no significant stenosis of the intracranial ICA segments, intracranial vertebral artery segments, or basilar artery. The proximal portions of the anterior, middle, and posterior cerebral arteries are patent. There is   significant vascular tortuosity of the proximal vessels of the posterior circulation.  ?  MRA of the neck demonstrates no high-grade focal stenosis of the common or internal carotid arteries or vertebral arteries. The left vertebral artery is dominant.    IMPRESSION:  MRI BRAIN: Indistinct region of mildly restricted diffusion in the right centrum semiovale, concerning for a region of acute ischemia. Punctate foci of diffusion-weighted hyperintensity in the left cerebellar hemisphere that may represent acute or   subacute lacunar infarcts. 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 No acute intracranial hemorrhage or significant mass effect.  ???  MRA BRAIN: No significant proximal intracranial stenosis or occlusion.  ???  MRA NECK: No hemodynamically-significant focal stenosis.    CT KUB 03/09/21  1. Limited evaluation of the bowel on noncontrast scan in the setting of diminished intra-abdominal fat. Prominent gaseous distention of colon. No overt small bowel dilation.  2. Mildly enlarged retroperitoneal lymph nodes and prominent iliac chain lymph nodes are of indeterminate clinical significance. Findings may be reactive, however, neoplastic causes cannot be excluded.  3. Hyperdense lesion in the upper pole of the left kidney is not fully characterized on limited noncontrast scan and may represent hemorrhagic cyst. Consider outpatient follow-up with CT renal protocol in the nonacute setting.  4. Cholelithiasis without biliary dilation or Bob signs of acute cholecystitis.  5. Clustered nodularity in the lung bases may represent a mild inflammatory and/or infectious process.    Relevant labs:  BMP:   Lab Results   Component Value Date/Time    NA 140 03/10/2021 05:21 AM    K 3.8 03/10/2021 05:21 AM    CL 107 (H) 03/10/2021 05:21 AM    CO2 22 03/10/2021 05:21 AM    BUN 10 03/10/2021 05:21 AM    CREAT 0.84 03/10/2021 05:21 AM    CALCIUM 8.4 (L) 03/10/2021 05:21 AM    GLUCOSE 86 03/10/2021 05:21 AM       CBC:   Lab Results   Component Value Date/Time    WBC 2.48 (L) 03/10/2021 05:21 AM    HGB 7.5 (L) 03/10/2021 05:21 AM    HCT 24.5 (L) 03/10/2021 05:21 AM    PLT 142 (L) 03/10/2021 05:21 AM        Bob:   Last Cal/iCalMg/Ph:   Lab Results   Component Value Date/Time    CALCIUM 8.4 (L) 03/10/2021 05:21 AM    MG 1.4 03/10/2021 05:21 AM    PHOS 3.1 03/09/2021 08:33 PM       Physical Exam:   BP 112/73  ~ Pulse 72  ~ Temp 37.1 ???C (98.8 ???F) (Oral) ~ Resp 18  ~ Ht 1.854 m (6' 1'')  ~ Wt 72.6 kg (160 lb)  ~ SpO2 98%  ~ BMI 21.11 kg/m???  General appearance: cachectic and combative  Neck: no JVD and supple, symmetrical, trachea midline  Lungs: no increased WOB  Unable to assess, per chart review -   ABD: Firm, tender with voluntary guarding, mildly distended, well healed midline scar  NEURO: *significant amount of exam declined by pt  - Mental Status:  Alert and oriented. Speech spontaneous and fluent, intact production, comprehension. Adequate fund of knowledge, vocabulary. Follows 1-step appendicular commands.  - Cranial Nerves: CN II, III, IV, VI formal testing declined by pt, facial sensation intact to light touch face except L V1. Symmetric at rest and with smile, lip purse, eye closure, brow raise. Hearing grossly intact  - Motor: Normal bulk and tone. No adventitious movements. No pronator drift. 5/5 strength RUE / RLE, 0/5 LUE except 2/5 finger extension, 0/5 LLE except 2/5 ankle dorsi/plantar flexion  - Sensory: Decreased to light touch LUE + LLE ''Feels like 30% of right side below ankle, otherwise no sensation''  - Reflexes: DTRs 2+ throughout. Toes mute to plantar stim bilaterally.  - Coordination: Intact finger-to-nose, heel-to-shin bilaterally.  - Gait/Station: Laying parallel to axis of bed, repositioning spontaneously.      Outpatient Provider To-Do List  - Management of GI bleed, CVA, cancer  - Need outside hospital records from [place], perhaps [facility]  LACE+ Score: 27 (03/10/21 0801) Minimal Risk of Readmission        Pending Labs   The following tests have been collected, but not yet resulted at the time of discharge.    Unresulted Labs (From admission, onward)             Start     Ordered    03/10/21 0600  Hgb A1c  [[id number]]  Once,   Routine         03/10/21 0601              The following labs have a preliminary result at the time of discharge.  Please check back if the final results have changed.  Preliminary Resulted Labs (From admission, onward) None        Discharge Medication List  Coumadin and Opiate Risk Assessment   This patient does not have coumadin on their discharge med list  No controlled substances on discharge med list   Current Discharge Medication List      CONTINUE these medications which have NOT CHANGED    Details   ATENOLOL PO Take by mouth.      calcium carbonate 500 mg chewable tablet Chew 1 tablet by mouth daily Calcium Carbonate 500 mg is equivalent to 200 mg elemental Calcium.      Ferrous Sulfate Dried (FERROUS SULFATE CR PO) Take by mouth.      Potassium Acetate POWD by Does not apply route.            Requested Appointments  There are no active discharge follow-up orders for this encounter.   Scheduled Appointments  No future appointments.       Nutrition Recommendations & Malnutrition Assessment      I have seen and examined the patient and agree with the RD assessment detailed below:  Patient meets criteria for:      (current weight 72.6 kg (160 lb), BMI (Calculated): 21.11;  ,  ). See RD notes for additional details.         The patient was not consulted or assessed by a Registered Dietitian (RD) during this admission.  Discharge Diet Orders: There are no active discharge diet orders  for this encounter.   Discharge Activity Orders   There are no active discharge activity orders for this encounter.   Rehab Assessment   No PT evaluation this encounter          No OT evaluation this encounter          Case Manager/Home Health Assessment                  Home Health Orders  There are no active discharge home health orders for this encounter.   Goals of Care Note (if new for this encounter)     I have seen and examined the patient on their discharge day. I have reviewed, edited, and agree with the above discharge summary.  Cleopatra Cedar, MD  03/10/2021 9:24 AM

## 2021-03-10 NOTE — ED Notes
Patient came back from xray. Patient requested to have the nurse right away as he needed. Patient was told that nurse was busy with another patient at that moment and would be with him as soon as possible. Patient started to threaten stating 'I will be fuck out of here if the nurse does not come right away right now.' This nurse was notified by another staff, and noticed patient was trying to wear his shoe. Noted patient was unstable to ambulate/movement, and patient assisted back to the bed. Primary ED doctor and ED attending @bedside  talking to patient. Will continue to monitor. A sitter @bedside  monitoring patient for safety.

## 2021-03-10 NOTE — Consults
[facility] ([facility])  Inpatient Neurology     PATIENT: Bob Garcia  MRN: [id number]  DOB: Oct 18, 1966  DATE OF SERVICE: 03/09/2021  PRIMARY CARE PHYSICIAN: No primary care provider on file.    ID/Chief Complaint: Bob Garcia is a left-handed 55 y.o. male who presents with a chief complaint of abdominal pain and melena x 1 day, however in ED complains of acute onset hemiplegia/hemiparesis as well.    History of Present Illness:  Last known well time (LKWD/T) was at 1940 on 03/09/21  The symptoms were first discovered at 1941 on 03/09/21 by patient.  Onset of symptoms was sudden. Symptoms not first noticed upon awakening.    Patient location when the stroke symptoms were discovered was other (specify in comments) (ED this hospital).   Patient arrived at this hospital via private vehicle from home/scene   Patient was not transferred from another hospital for  .            Current symptoms include extremity weakness, extremity numbness, facial droop, slurred speech, visual disturbance (monocular diplopia OD), affecting primarily the left side.  From the time of onset to the time of first stroke team evaluation, the symptoms stablized.            The patient denies other (specify in comments) (melena, abdominal pain).    Additional aspects of present illness are as follows:     Delays in obtaining history, exam, MRI screening form due to patient actively refusing repeatedly until he receives US-guided IV for IV analgesics, stating he can ''only do one thing at a time.'' Contrast imaging (both CT and MRI) refused by pt due to patient stating he has anaphylactic allergy to ''some chemical'' in contrast dye that is not iodine. Per pt report, was recently at [facility]. 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  However in the ED waiting room, patient became acutely a hemiparetic on the left side prompting stroke code activation.    Symptoms stabilized by time of neuro exam.  Patient is only on atenolol and mineral supplements.  Reports being recently discharged from several months day 8 days ago from [facility].  States Hgb was 11.2 at [facility] oncology clinic check 48 hours ago.    SocHx:   - active smoker times 50 years times 0.25 packs per day  - retired IT sales professional  - Son is Teaching laboratory technician at [employer]. Sister is RN in [place], per states she will drive up to visit  - here in [place] since mother recently passed away    family history:  - ?hemorrhagic stroke in father at age 30    Further history limited. Pending records linkage [facility] / collateral to obtain further history.       ED Course: KUB + CT non-con abd planned for possible ongoing acute abdominal pathology as well. Hgb 7.9; pt reports was 11.2, 48 hours prior, at [facility] oncology clinic.  Not tPA candidate given active ongoing GI bleed.  Not mechanical thrombectomy candidate given vessels  patent.    Review of Systems:  A 14 point review of systems was negative except as noted      Stroke-Specific Past History:  The patient has the following stroke risk factors: smoker (current or within past 1 year), other (specify in comments) (active cancer / presumed hypercoag)  Prior stroke/TIA history: none    Prior to the current event:  ??? Patient's ambulatory status: not known   ??? Patient's Modified Rankin Scale Disability Score: not performed. (details per pt vague, pending [facility] records)    Treatment Interventions:    ED Course/Stroke Team Response Metrics:  Date of ED & Stroke Team Responses: 03/09/21  Time Stroke Page Received: 2009  Time Stroke Team Arrived on Scene: 2011   Time of Patient ED Arrival: 2001   Time MRI/CT Reviewed: 2114    Stroke Treatment Intervention Metrics:                     Thrombolytic Treatment/ Non-Treatment Reasons   Was an IV thrombolytic administered?: IV thrombolytic NOT administered (Patient arrived < 2 hours since LKWT).                            Reason(s) IV thrombolytic NOT Initiated within 0-3 Hour Treatment Window Exclusion Criteria (CONTRAINDICATIONS)  : C4: Active internal bleeding           Endovascular Treament  Was an endovascular reperfusion procedure performed (Catheter-based Stroke Treatment: IA thrombolytic and/ or Mechanical Endovascular Reperfusion-MER)?: Endovascular reperfusion procedure NOT performed.                        Past Medical/Surgical History:  Past Medical History:   Diagnosis Date   ? Anemia    ? Cancer (HCC/RAF)    ? Hypertension      Past Surgical History:   Procedure Laterality Date   ? ABDOMINAL SURGERY         Medications:  Immediately Prior to this Hospitalization:  (Not in a hospital admission)    ?  Current Post-admission Medications:  Continuous Infusions:  PRN Meds:    Allergies:  Allergies   Allergen Reactions   ? Iodinated Diagnostic Agents Anaphylaxis   ? Fentanyl    ? Morphine        Family History:  family history is not on file.    Social History:  Pt         Objective:     Vital signs for last 24 hours:  Temp:  [36.6 ?C (97.9 ?F)-37.3 ?C (99.1 ?F)] 37.3 ?C (99.1 ?F)  Heart Rate:  [68-75] 68  Resp:  [16-19] 16  BP: (129-135)/(81-85) 129/85  SpO2:  [98 %-99 %] 99 %    Physical Exam  GENERAL: Lying in bed, in acute distress  HEENT: Mucus membranes moist, no ocular discharge  NECK: Supple, normal range of motion  PULM: symmetric chest rise w/ inspiration, no increased work of breathing or accessory muscle use  CV: peripheral radial pulses palpable, and equal symmetrically  ABD: Soft, non-distended  EXT: No cyanosis or edema  SKIN: No rashes or wounds  NEURO: *significant amount of exam declined by pt  - Mental Status:  Alert and oriented. Speech spontaneous and fluent, intact production, comprehension. Adequate fund of knowledge, vocabulary. Follows 1-step appendicular commands.  - Cranial Nerves: CN II, III, IV, VI formal testing declined by pt, facial sensation intact to light touch face except  L V1. Symmetric at rest and with smile, lip purse, eye closure, brow raise. Hearing grossly intact  - Motor: Normal bulk and tone. No adventitious movements. No pronator drift. 5/5 strength RUE / RLE, 0/5 LUE except 2/5 finger extension, 0/5 LLE except 2/5 ankle dorsi/plantar flexion  - Sensory: Decreased to light touch LUE + LLE ''Feels like 30% of right side below ankle, otherwise no sensation''  - Reflexes: DTRs 2+ throughout. Toes mute to plantar stim bilaterally.  - Coordination: Intact finger-to-nose, heel-to-shin bilaterally.  - Gait/Station: Laying parallel to axis of bed, repositioning spontaneously.        Patient is currently: unable to ambulate.  NIH Stroke Scale:  Interval: Baseline  Level of Consciousness (1a): 0 - Alert, keenly responsive  LOC Questions (1b): 0 - Answers both questions correctly  LOC Commands (1c): 0 - Performs both tasks correctly  Best Gaze (2): 0 - Normal  Visual (3): 0 - No visual loss  Facial Palsy (4): 3 - Complete paralysis of one or both sides  Motor Arm, Left (5a): 4 - No movement  Motor Arm, Right (5b): 0 - No drift  Motor Leg, Left (6a): 3 - No effort against gravity  Motor Leg, Right (6b): 0 - No drift  Limb Ataxia (7): 0 - Absent  Sensory (8): 1 - Mild-to-moderate sensory loss, patient feels pinprick is less sharp or is dull on the affected side, or there is a loss of superficial pain with pinprick, but patient is aware of being touched  Best Language (9): 0 - No aphasia  Dysarthria (10): 1 - Mild-to-moderate dysarthria, patient slurs at least some words and, at worst, can be understood with some difficulty  Extinction and Inattention (formerly Neglect) (11) : 0 - No abnormality  NIHSS Total: 12  Person Administering Scale: Vita Barley, MD PGY-2  ?  Labs: ?  General Labs:   Recent Labs   Lab 03/09/21  2033   WBC 3.00*   HGB 7.9*   MCV 90.2   PLT 158     Recent Labs   Lab 03/09/21  2033   NA 142   K 4.0   CL 102   CO2 23   CREAT 0.92   GLUCOSE 105*   MG 1.5   PHOS 3.1     Recent Labs   Lab 03/09/21  2033   AST 25   ALT 23   ALKPHOS 73   BILITOT 0.3   BILICON <0.2   ALBUMIN 3.8*     Recent Labs   Lab 03/09/21  2033   PT 13.0   INR 1.0   No results for input(s): SPECGRAVUR, PHUR, BILIUR, KETONESUR, GLUCOSEUR, PROTCLUR, NITRITEUR, LEUKESTUR, RBCSUR, WBCSUR in the last 168 hours.  Stroke Labs: No results for input(s): HGBA1C, CHOL, CHOLDLCAL, CHOLHDL, TRIGLY in the last 168 hours.  Recent Labs   Lab 03/09/21  2033   TROPONIN <0.04   DDIMER 1.76*     Imaging:    CT/CTA stroke protocol:  No evidence of acute intracranial hemorrhage or mass effect.    MRI/MRA stroke protocol:  MRI BRAIN: Indistinct region of mildly restricted diffusion in the right centrum semiovale, concerning for a region of acute ischemia. Punctate foci of diffusion-weighted hyperintensity in the left cerebellar hemisphere that may represent acute or subacute lacunar infarcts. 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 No acute intracranial hemorrhage or significant mass effect.  MRA BRAIN: No significant proximal intracranial stenosis or occlusion.  MRA NECK: No hemodynamically-significant focal stenosis.  EKG  NSR    CT KUB  Pending read    Assessment:     Yonas Bunda is a left-handed 55 y.o. male who presents with abdominal pain, melena, L hemiparesis and hemisensory changes. Symptoms localize to R corticospinal tract & adjacent sensory tracts. Imaging demonstrates acute R centrum semiovale indistinct diffusion restriction likely representing acute ischemia and L cerebellar acute or subacute infarcts  Classification of Cerebral Ischemic Event: Ischemic Stroke  TOAST Classification: Stroke of other determined etiology (e.g. dissection, vasculopathy) (hypercoagulable state in setting of cancer)  Stroke of Other Determined Etiology: Hypercoagulability     Elevated D-dimer noted  Hgb 7.9 noted        Plan:     Ischemic Stroke: Leading considerations in the differential diagnosis for ischemic stroke mechanism include: other defined cause  Hypercoagulability of cancer  Admission:  (per primary)--ongoing melena with GI cancer history, reportedly Hgb dropped to 7.9 from 11.2 two days ago, noted      Monitoring:  (per primary)--permissive hypertension 220/120 ok from neuro standpoint, but defer BP management to primary given concern for acute GI bleed possibly large-volume  Medications: Other (specify in comments) (per primary)  Fluids: Other (specify in comments) (per primary)  Follow-up Neuroimaging: Other (specify in comments), CT scan (specify timeframe) (consider repeat CTH in AM confirm no hemorrhage)  Consults: Other (specify in comments) (per primary, but consider PT, OT, SLP if fails swallow; ongoing abdominal and cancer pathology consults per primary)  Follow-up Labs/ Tests: Other (specify in comments), Stroke labs including: CBC, CMP, PT/INR, ESR, hs-CRP, troponins, U/A, HgBA1C, lipids, ECHO with bubbles ((per primary but recommend these studies))  Electrolytes: Other (specify in comments) (per primary)  Nutrition: Other (specify in comments), NPO pending SLP evaluation/treatment (per primary)  VTE Prophylaxis: Other (specify in comments) (per primary)  Disposition - Anticipated: Other (specify in comments) (per primary)   OTHER:  - follow up outside hospital records [facility] once linked ([facility] vs SW consult to upload records)  - smoking cessation consult if willing    The patients plan of care was discussed with the Stroke Fellow and Attending Physician.    The plan of care was discussed with the Stroke Fellow and Attending Physicians.    Vita Barley, MD  Neurology Resident

## 2021-03-10 NOTE — Progress Notes
03/10/21 0847   Time Calculation   Start Time 0840   Doc Time (min) 5 min   Patient not seen due to Patient not available  (Pt left AMA)       Order received for PT evaluation. Chart review completed. Spoke with bedside RN, pt has left AMA.  Will disregard PT evaluation order.     Elouise Munroe, PT, DPT 03/10/2021  [phone]

## 2021-03-10 NOTE — ED Notes
Pt insisting on one thing at a time, Will not allow for med recs, neuro leg eval, or MRI screening form while RN is attempting to obtain IV. Pt made aware of the risks of this by neuro MD.

## 2021-03-10 NOTE — ED Notes
L sided weakness noted in upon checking, Dr. Mikki Santee called - code stroke called. Charge RN notified - pt moved to room 15

## 2021-03-10 NOTE — Significant Event
Paged by MD Carrie Mew that patient is very upset about being in the Emergency Dept. Patient states that his nurse is not readily available and he didn't receive help transferring to the MRI for example. Per chart review, patient has been frustrated throughout the night and at one point left the ED to go outside possibly to smoke a cigarette. He is frustrated that he hasn't received IV dilaudid since 4 AM. On examination, patient appears chronically ill and yelling. He is speaking in complete sentences and demonstrates the left upper extremity hemiparesis. I attempted to get IV pain medications in an expedited fashion and speak with pain, but he continued to yell loudly and become progressively more angry about overnight events. He is asking to leave AMA to go to another institution. Risks discussed about leaving AMA including worsening bleeding, pain, and death. Patient understands and is still asking to leave as he discussed with MD Carrie Mew.     Code gray called once patient got up from bed and was yelling in the hallway. RN attempted to assist with removing PIV, but patient declined assistance and removed PIV himself, leading to mild amount of bleeding. Patient signed AMA and was assisted out of the ED with security.     Will attempt to call [facility] ED in case patient presents himself there to assist with continuity of care.     Cleopatra [facility] MD  Internal Medicine PGY2    Attending Addendum    I did not evaluate patient as he left against medical advice before my ability to assess him. He appears to have had capacity to leave against medical advice.    Fransico Michael, MD  Attending Physician  [facility]  03/10/2021 3:31 PM

## 2021-03-10 NOTE — ED Notes
Pt aaox4. Reports L sided weakness and numbness 20 mins prior to being roomed +slurred speech. Pt also reports recent admission in [facility] for perforated bowel. 10/10 pain and reports black stools for 9hrs.

## 2021-03-10 NOTE — ED Notes
Patient transported to x-ray. ?

## 2021-03-10 NOTE — ED Notes
Patient threatening to leave AMA. Hospitalist at bedside, speaking to patient. Patient allowed to go outside per MD. Patient taken outside accompanied by care partner. Per care partner, patient smoked a cigarette.

## 2021-03-10 NOTE — ED Notes
Surgery MD at bedside.

## 2021-03-10 NOTE — ED Notes
Assumed care of patient, patient stating he's leaving. ''I'm getting out of here. There's a intermediate trauma coming that means its going to take longer.'' Patient using foul langauge, impatient, impulsive, demanding for clothes and pulling off blood pressure cuff, cardiac monitor, removed gown.  Patient observed to not have steady gait, noted with left leg weakness, and left arm weakness. Patient educate, informed waiting imaging results. Dr. Mikki Santee and Dr. Meda Coffee informed of patient wanting to leave.

## 2021-03-10 NOTE — ED Notes
Patient continues to refuse cardiac monitor despite education. Per patient, ''I'm allergic, it makes me itchy.''

## 2021-03-10 NOTE — ED Notes
Report given to Krisha, RN

## 2021-03-10 NOTE — Progress Notes
03/10/21 0954   Time Calculation   Start Time 0950   Stop Time 0954   Time Calculation (min) 4 min   Patient not seen due to Other (Comment)  (Order received for OT evaluation. Chart review completed. Per chart review, pt has left AMA.)     Will disregard OT evaluation order.    Adriana Simas, OTR/L   Pager #: [phone]

## 2021-03-10 NOTE — Consults
What is your recommended disposition (Admit/Obs vs Discharge) for this patient? Admit/Obs to a different Service (please give details as free text)    Other recommendations:  - Given Hgb dropped from 7.9 today from reportedly 11.2 per pt at [facility] clinic 48 hours prior, in the setting of GI cancer, discussed with ED admission to different service pending CT KUB read--query possible need for acute surgery  - Acute brain ischemia / acute vs subacute strokes--permissive HTN goal <220/120 x 24 hours. Routine stroke workup as discussed further in full consult onte    Likely brain infarcts occurred 2/2 known cancer with likely associate hypercoag state    Brief Consult Note process instructions: The objectives of this note are to document the critical recommendations of the consult service and mark the end of this consult's turnaround time (TAT). Please DO NOT sign this note if you have not been provided sufficient data (eg imaging results) to recommend a disposition. This note should supplement, and NOT replace, verbal communication with the service requesting this consult.

## 2021-03-10 NOTE — ED Notes
Patient requesting for pain medication, demanding, verbally aggressive towards staff, yelling, threatening to leave. Patient in street clothing, earlier refusing to put hospital gown back on. MD at bedside speaking with patient.

## 2021-03-10 NOTE — Consults
CLINICAL SOCIAL WORKER BRIEF ASSESSMENT      Admit Date:03/10/2021    Date of CSW Assessment: 03/10/2021    Problems: Active Problems:    Large bowel obstruction (HCC/RAF) POA: Yes       Past Medical History:   Diagnosis Date    Anemia     Cancer (HCC/RAF)     Hypertension     Past Surgical History:   Procedure Laterality Date    ABDOMINAL SURGERY            Primary Care Physician:Pcp, No, MD  Phone:None       ASSESSMENT:     Date Seen: 03/10/21    Date Referred:  03/10/21    Referral Source: MD    Reason for Referral: Financial Issues, Other (comment)    Source of Information: Consultation       Consult Source: MD                    Does the patient have a Family/Support System member participating in Discharge Planning? No    Family/Support System in agreement with the current discharge plan? Yes, in agreement but not participating    lnterventions: Discussed role of CSW and informed CSW availability for support and resources, Coordinated care with multidisciplinary team, Other (comment)    Outcome: CSW responded to MD's referral regarding identified concerns for sorting out patient's current health insurance information.CSW provided supportive follow up to provide support with needs, the patient appeared DC from [facility], No SW involvement needed at this time.    Plan/Recommendations: Discharge to Facility            Projected Date of Discharge:     Clelia Croft, MSW,  03/10/2021

## 2021-03-11 DIAGNOSIS — I639 Cerebral infarction, unspecified: Secondary | ICD-10-CM

## 2021-03-11 NOTE — ED Provider Notes
Bob Garcia [facility]  Emergency Department Service Report    Triage     Bob Garcia, a 55 y.o. male, presents with Altered Mental Status (L sided weakness, happened 20 mins ago, +slurred speech, here from [place] for family funeral) and Abdominal Pain (Abd pain and distention, +black stools)    Arrived on 03/09/2021 at 8:01 PM   Arrived by Walk-in [14]    ED Triage Vitals   Temp Temp Source BP Heart Rate Resp SpO2 O2 Device Pain Score Weight   03/09/21 2003 03/09/21 2003 03/09/21 2003 03/09/21 2003 03/09/21 2003 03/09/21 2003 03/09/21 2003 03/09/21 2015 03/09/21 2015   36.6 ?C (97.9 ?F) Temporal 135/81 75 19 98 % None (Room air) Ten 72.6 kg (160 lb)       Allergies   Allergen Reactions   ? Iodinated Diagnostic Agents Anaphylaxis   ? Fentanyl    ? Morphine         Initial Physician Contact       Comprehensive Exam Initiated  Contact Date: 03/09/21  Contact Time: 2022    History   HPI   55 y/o M hx rectal CA pw abd pain and L sided weakness. Abd pain is acute on chronic and worsened today. Pt sts he has hx 2 bowel perforations and was recently admitted in [place] for ischemic bowel but left AMA to come to [place] for death in the family. While in waiting room pt became suddenly weak on L sided of body. No hx of the same. Also reports few days hx of hematemesis and blood in the stools.    Past Medical History:   Diagnosis Date   ? Anemia    ? Cancer (HCC/RAF)    ? Hypertension         Past Surgical History:   Procedure Laterality Date   ? ABDOMINAL SURGERY          Past Family History   family history is not on file.     Past Social History   he has no history on file for tobacco use, alcohol use, drug use, and sexual activity.     Review of Systems  Review of Systems   Constitutional: Negative for chills and fever.   HENT: Negative for congestion, rhinorrhea and sore throat.    Respiratory: Negative for cough and shortness of breath.    Cardiovascular: Negative for chest pain and palpitations. Gastrointestinal: As per HPI  Genitourinary: Negative for dysuria, frequency and hematuria.   Neurological: as per HPI.   All other systems reviewed and are negative.        Physical Exam   Physical Exam  Gen: in distress, dysarthric and asking for pain meds  Eyes: sclera anicteric, conjunctiva noninjected, pupils equal in diameter  HENT: hearing intact grossly, moist mucous membranes, oropharynx clear  CV: RRR, no murmurs appreciated, no LE edema bilaterally  Lung: normal work of breathing without accessory muscle use, CTAB without wheezes, rales, or rhonchi   GI: rigid, tympanic abdomen exquisitely tender to palpation, old surgical scars  MSK: no swelling or deformity of joints  Skin: no rashes, warm to touch  Psych: mood and affect congruent to context; oriented, cooperative  Neuro: AOx3, PERRL, R facial droop, L arm flaccid paralysis, LLE strength 2/5, RUE and RLE strength normal    Medical Decision Making   Bob Garcia is a 55 y.o. male hx rectal CA, multiple bowel surgeries, bowel perf, pw abd pain and L hemiparesis. Code stroke  called immediately upon triage and pt taken immediately to MRI. Note that pt reported both iodine and gadolinium contrast allergies. There was also significant delay in obtaining MRI imaging as pt first was refusing to any scans util given IV dilaudid x2, then demanded he have abdominal imaging first. Explained to pt rationale for head imaging first and that abd imaging would be obtained immediately after. MRI brain initially read as midbrain/pontine hemorrhagic stroke by neurology prelim but then on final read was acute ischemic stroke no bleed. CT abd pelvis showed severely dilated large bowel but without contrast was unable to determine if ischemic. Gen surg was consulted and recommended ostomy however pt refused and therefore rec supportive care. Pt was admitted to medicine.    Note that pt was repeatedly disruptive and verbally abusive to staff and threatened to leave AMA multiple times when he felt slighted or wronged, such as when asked to move himself onto the radiology table or if his nurse did not come quickly enough when called.      Chart Review   Previous medical records requested.  Pertinent items reviewed:     ED Course      Laboratory Results     Labs Reviewed   CBC - Abnormal; Notable for the following components:       Result Value    White Blood Cell Count 3.00 (*)     Red Blood Cell Count 2.85 (*)     Hemoglobin 7.9 (*)     Hematocrit 25.7 (*)     MCH Concentration 30.7 (*)     Red Cell Distribution Width-SD 67.8 (*)     Red Cell Distribution Width-CV 20.7 (*)     All other components within normal limits   GLUCOSE - Abnormal; Notable for the following components:    Glucose 105 (*)     All other components within normal limits   D-DIMER - Abnormal; Notable for the following components:    D-Dimer 1.76 (*)     All other components within normal limits    Narrative:     *ALERT:New Reference Range (<0.6 ug/mL FEU)*  *ALERT:New Reportable Units (ug/mL FEU)*  *ALERT:New Cut-off Values for DVT/PE    Note: Quantitative method to detect D-Dimer.    Cut-off value to rule out DVT/PE in patients with low/moderate pretest probability: <0.50 ug/mL FEU   ALBUMIN - Abnormal; Notable for the following components:    Albumin 3.8 (*)     All other components within normal limits   CALCIUM - Abnormal; Notable for the following components:    Calcium 8.3 (*)     All other components within normal limits   POCT GLUCOSE - Abnormal; Notable for the following components:    Glucose, Point of Care 107 (*)     All other components within normal limits   ELECTROLYTE PANEL - Normal   TROPONIN I - Normal   SEDIMENTATION RATE, ERYTHROCYTE - Normal   PROTHROMBIN TIME PANEL - Normal   APTT - Normal   FIBRINOGEN - Normal   AST (SGOT) - Normal   ALT (SGPT) - Normal   BILIRUBIN,TOTAL - Normal   BILIRUBIN,CONJ - Normal   ALKALINE PHOSPHATASE - Normal   PHOSPHORUS - Normal   MAGNESIUM - Normal C-REACTIVE PROTEIN - Normal   LACTATE - Normal   EXPEDITED COVID-19 AND INFLUENZA A B PCR, RESPIRATORY UPPER    Narrative:     This test is intended for in vitro diagnostic use under FDA Emergency  Use Authorization only. Results are for the presumptive identification of COVID-19, Influenza A, and Influenza B RNA. The  Clinical Laboratory is certified under the Clinical Laboratory Improvement Amendments of 1988 (CLIA-88) as qualified to perform high complexity clinical laboratory testing.   CREATININE,WHOLE BLOOD   RAINBOW DRAW TO LABORATORY    Narrative:     The following orders were created for panel order Rainbow Draw (ED Stroke Signs/Suspected: Nurse Protocol).  Procedure                               Abnormality         Status                     ---------                               -----------         ------                     Extra Burna Mortimer [id number]]                            Final result               Extra Burna Mortimer [id number]                                                       Extra Burna Mortimer [id number]                                                       Extra Lavender [phone]                               Final result                 Please view results for these tests on the individual orders.   EXTRA LIGHT GREEN TOP   EXTRA LAVENDER TOP   TYPE AND SCREEN       Imaging Results     XR kub (1 view)   Final Result by Eloy End. (05/16 0355)      CT kub wo contrast   Final Result by Eloy End. (05/16 0148)   IMPRESSION:      1. Limited evaluation of the bowel on noncontrast scan in the setting of diminished intra-abdominal fat. Prominent gaseous distention of colon. No overt small bowel dilation.   2. Mildly enlarged retroperitoneal lymph nodes and prominent iliac chain lymph nodes are of indeterminate clinical significance. Findings may be reactive, however, neoplastic causes cannot be excluded.   3. Hyperdense lesion in the upper pole of the left kidney is not fully characterized on limited noncontrast scan and may represent hemorrhagic cyst. Consider outpatient follow-up with CT renal protocol in the nonacute setting.   4. Cholelithiasis without biliary dilation or other signs of acute cholecystitis.   5. Clustered nodularity  in the lung bases may represent a mild inflammatory and/or infectious process.         I, Royetta Asal, MD, PhD, have reviewed the study and agree with the findings presented above.         Signed by: Royetta Asal   03/10/2021 1:48 AM      CT brain wo contrast   Final Result by Rayfield Citizen, MD (05/15 2217)   IMPRESSION:      No evidence of acute intracranial hemorrhage or mass effect.      Signed by: Rayfield Citizen   03/09/2021 10:17 PM      MR neck angiogram wo contrast   Final Result by Rayfield Citizen, MD (05/15 2213)   IMPRESSION:      MRI BRAIN: Indistinct region of mildly restricted diffusion in the right centrum semiovale, concerning for a region of acute ischemia. Punctate foci of diffusion-weighted hyperintensity in the left cerebellar hemisphere that may represent acute or    subacute lacunar infarcts. 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 No acute intracranial hemorrhage or significant mass effect.      MRA BRAIN: No significant proximal intracranial stenosis or occlusion.      MRA NECK: No hemodynamically-significant focal stenosis.      Signed by: Rayfield Citizen   03/09/2021 10:13 PM      MR brain angiogram wo contrast   Final Result by Rayfield Citizen, MD (05/15 2213)   IMPRESSION:      MRI BRAIN: Indistinct region of mildly restricted diffusion in the right centrum semiovale, concerning for a region of acute ischemia. Punctate foci of diffusion-weighted hyperintensity in the left cerebellar hemisphere that may represent acute or    subacute lacunar infarcts. 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 No acute intracranial hemorrhage or significant mass effect.      MRA BRAIN: No significant proximal intracranial stenosis or occlusion.      MRA NECK: No hemodynamically-significant focal stenosis.      Signed by: Rayfield Citizen   03/09/2021 10:13 PM      MR brain wo contrast   Final Result by Rayfield Citizen, MD (05/15 2213)   IMPRESSION:      MRI BRAIN: Indistinct region of mildly restricted diffusion in the right centrum semiovale, concerning for a region of acute ischemia. Punctate foci of diffusion-weighted hyperintensity in the left cerebellar hemisphere that may represent acute or    subacute lacunar infarcts. 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 No acute intracranial hemorrhage or significant mass effect.      MRA BRAIN: No significant proximal intracranial stenosis or occlusion.      MRA NECK: No hemodynamically-significant focal stenosis.      Signed by: Rayfield Citizen   03/09/2021 10:13 PM          Consults     ED CM Consult Completed    Date and Time Initial Review Complete User   03/10/21 0546 Completed JWL            Progress Notes / Reassessments     ED Course as of 03/14/21 1403   Sun Mar 09, 2021   2100 There was significant delay in obtaining MRI and CT due to patient refusing to go to MRI until given multiple doses of pain medicine. Pt not concerned about his brain and wants imaging of his abdomen first. Explained to pt we have ordered both brain and abdominal imaging and will get both as soon as possible. Pt then amenable to MRI brain [TJ]   2129 R  midbrain and pons bleed on MRI. Will add on CTH to eval chronicity [TJ]   2153 DW neuro who has reviewed CTH. Looks like  [TJ]   2154 Bleed is old. Suspect pt is recrudescing iso abd pain. [TJ]   2213 CT brain wo contrast [DW]   Mon Mar 10, 2021   1610 Signed out to medicine [TJ]      ED Course User Index  [DW] Heloise Ochoa., MD  [TJ] Kathleen Argue., MD       ED Procedure Notes     Any ED procedures performed are documented on separate ED procedure notes.    Clinical Impression     1. Large bowel obstruction (HCC/RAF)    2. Abdominal pain, acute        Disposition and Follow-up   Disposition: AMA [4]       No future appointments.    Follow up with:  No follow-up provider specified.    Return precautions are specified on After Visit Summary.    Discharge Medication List as of 03/10/2021 10:10 AM          Orders Placed This Encounter   ? Expedited COVID-19 and Influenza A B PCR, Respiratory Upper (ED Stroke Signs/Suspected: Nurse Protocol)   ? MR brain wo contrast   ? MR brain angiogram wo contrast   ? MR neck angiogram wo contrast   ? XR kub (1 view)   ? CT kub wo contrast   ? CT brain wo contrast   ? CBC (ED Stroke Signs/Suspected: Nurse Protocol)   ? Electrolyte Panel (ED Stroke Signs/Suspected: Nurse Protocol)   ? Creatinine,whole blood (ED Stroke Signs/Suspected: Nurse Protocol)   ? Glucose (ED Stroke Signs/Suspected: Nurse Protocol)   ? Troponin I (ED Stroke Signs/Suspected: Nurse Protocol)   ? Sedimentation Rate, Erythrocyte (ED Stroke Signs/Suspected: Nurse Protocol)   ? Prothrombin time (PT/INR) (ED Stroke Signs/Suspected: Nurse Protocol)   ? APTT (ED Stroke Signs/Suspected: Nurse Protocol)   ? Fibrinogen (ED Stroke Signs/Suspected: Nurse Protocol)   ? D-Dimer (ED Stroke Signs/Suspected: Nurse Protocol)   ? Albumin (ED Stroke Signs/Suspected: Nurse Protocol)   ? AST (SGOT) (ED Stroke Signs/Suspected: Nurse Protocol)   ? ALT (SGPT) (ED Stroke Signs/Suspected: Nurse Protocol)   ? Bilirubin,Total (ED Stroke Signs/Suspected: Nurse Protocol)   ? Bilirubin,Conj (ED Stroke Signs/Suspected: Nurse Protocol)   ? Alkaline Phosphatase (ED Stroke Signs/Suspected: Nurse Protocol)   ? Phosphorus (ED Stroke Signs/Suspected: Nurse Protocol)   ? Calcium (ED Stroke Signs/Suspected: Nurse Protocol)   ? Magnesium (ED Stroke Signs/Suspected: Nurse Protocol)   ? C-Reactive Protein (ED Stroke Signs/Suspected: Nurse Protocol)   ? Rainbow Draw (ED Stroke Signs/Suspected: Nurse Protocol)   ? Extra Burna Mortimer Top   ? Extra Lavender Top   ? Lactate   ? Iron & Iron Binding Capacity   ? Ferritin   ? CEA   ??? Folate,Serum   ??? Vitamin B12   ??? Reticulocyte Count   ??? LD   ??? Haptoglobin   ??? CBC   ??? Differential, Automated   ??? Hgb A1c   ??? Lipid Panel   ??? Weigh patient in kilograms (in case of TPA administration) (ED Stroke Signs/Suspected: Nurse Protocol)   ??? Activate code stroke (ED Stroke Signs/Suspected: Nurse Protocol)   ??? MRI gurney to patient room (ED Stroke Signs/Suspected: Nurse Protocol)   ??? Code Stroke packet to patient room (ED Stroke Signs/Suspected: Nurse Protocol)   ??? Complete Swallow Screen (ED Stroke Signs/Suspected: Nurse  Protocol)   ??? Cardiac monitoring - Arrhythmia Management   ??? Consult to Social Work   ??? Isolation Status: Add: Engineer, materials; Remove: N/A   ??? POCT glucose   ??? ECG 12 lead (ED Stroke Signs/Suspected: Nurse Protocol)   ??? EKG REPORT   ??? Type and screen (ED Stroke Signs/Suspected: Nurse Protocol)   ??? Bed Request   ??? Admit to Inpatient (Non-Attending)   ??? HYDROmorphone 1 mg/mL inj 1 mg   ??? Potassium Acetate POWD   ??? ATENOLOL PO   ??? calcium carbonate 500 mg chewable tablet   ??? Ferrous Sulfate Dried (FERROUS SULFATE CR PO)   ??? HYDROmorphone 1 mg/mL inj 2 mg   ??? HYDROmorphone 1 mg/mL inj 1 mg   ??? HYDROmorphone 1 mg/mL inj 2 mg   ??? HYDROmorphone 1 mg/mL inj 2 mg   ??? ondansetron 4 mg/2 mL inj 4 mg   ??? AND Linked Order Group    ??? albuterol (2.5 mg/0.5 mL) 0.5% nebu soln 5 mg    ??? ipratropium 0.02% nebu soln 500 mcg   ??? bisacodyl supp 10 mg   ??? HYDROmorphone 2 mg/mL inj 2 mg   ??? DISCONTD: HYDROmorphone 2 mg/mL inj 2 mg   ??? DISCONTD: bisacodyl supp 10 mg   ??? DISCONTD: lactated ringers IV soln   ??? magnesium sulfate 4 g in water for injection 100 mL RTU   ??? HYDROmorphone 1 mg/mL inj 1 mg   ??? DISCONTD: sodium chloride 0.9% IV soln   ??? DISCONTD: cyanocobalamin tab 1,000 mcg   ??? DISCONTD: cyanocobalamin 1,000 mcg/mL inj 1,000 mcg           Resident Signature            Kathleen Argue., MD  Resident  03/11/21 2047    MD Critical Care Excluding Separately Billable Procedures  Time: 60 minutes  Time spent on: examination of patient, development of treatment plan, re-evaluations, ordering, performing treatment, interventions  Reason: High probability of multi-organ failure that required my full attention while the patient was critical.          Heloise Ochoa., MD  03/14/21 1403        Attestation     ATTENDING NOTE    I was present with the resident during the key/critical portions of this service.  I have discussed the management with the resident, have reviewed the resident note and agree with the documented findings and plan of care.           Heloise Ochoa., MD  03/19/21 2031
